# Patient Record
Sex: Male | Born: 1990 | Race: Black or African American | Hispanic: No | Marital: Single | Smoking: Current every day smoker
Health system: Southern US, Community
[De-identification: ages and names within clinical notes are randomized; demographics above are authoritative.]

## PROBLEM LIST (undated history)

## (undated) HISTORY — PX: FINGER SURGERY: SHX640

## (undated) HISTORY — PX: TONSILLECTOMY: SUR1361

---
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
## 2010-07-17 ENCOUNTER — Emergency Department (HOSPITAL_COMMUNITY): Admission: EM | Admit: 2010-07-17 | Discharge: 2010-07-17 | Payer: Self-pay | Source: Home / Self Care

## 2011-01-17 ENCOUNTER — Emergency Department (HOSPITAL_COMMUNITY): Payer: Medicaid Other

## 2011-01-17 ENCOUNTER — Emergency Department (HOSPITAL_COMMUNITY)
Admission: EM | Admit: 2011-01-17 | Discharge: 2011-01-17 | Disposition: A | Discharge status: Home / Self Care | Payer: Medicaid Other | Attending: Emergency Medicine | Admitting: Emergency Medicine

## 2011-01-17 DIAGNOSIS — W219XXA Striking against or struck by unspecified sports equipment, initial encounter: Secondary | ICD-10-CM | POA: Insufficient documentation

## 2011-01-17 DIAGNOSIS — S60229A Contusion of unspecified hand, initial encounter: Secondary | ICD-10-CM | POA: Insufficient documentation

## 2011-01-17 DIAGNOSIS — Y9367 Activity, basketball: Secondary | ICD-10-CM | POA: Insufficient documentation

## 2011-07-12 ENCOUNTER — Encounter: Payer: Self-pay | Admitting: *Deleted

## 2011-07-12 ENCOUNTER — Emergency Department (HOSPITAL_COMMUNITY): Payer: Medicaid Other

## 2011-07-12 ENCOUNTER — Emergency Department (HOSPITAL_COMMUNITY)
Admission: EM | Admit: 2011-07-12 | Discharge: 2011-07-12 | Disposition: A | Discharge status: Home / Self Care | Payer: Medicaid Other | Attending: Emergency Medicine | Admitting: Emergency Medicine

## 2011-07-12 DIAGNOSIS — S62609A Fracture of unspecified phalanx of unspecified finger, initial encounter for closed fracture: Secondary | ICD-10-CM

## 2011-07-12 DIAGNOSIS — Y92410 Unspecified street and highway as the place of occurrence of the external cause: Secondary | ICD-10-CM | POA: Insufficient documentation

## 2011-07-12 DIAGNOSIS — IMO0002 Reserved for concepts with insufficient information to code with codable children: Secondary | ICD-10-CM | POA: Insufficient documentation

## 2011-07-12 DIAGNOSIS — F17200 Nicotine dependence, unspecified, uncomplicated: Secondary | ICD-10-CM | POA: Insufficient documentation

## 2011-07-12 MED ORDER — ONDANSETRON 8 MG PO TBDP
8.0000 mg | ORAL_TABLET | Freq: Once | ORAL | Status: AC
  Administered 2011-07-12: 8 mg via ORAL
  Filled 2011-07-12: qty 1

## 2011-07-12 MED ORDER — OXYCODONE-ACETAMINOPHEN 5-325 MG PO TABS: 1.0000 | ORAL_TABLET | ORAL | Status: AC | PRN

## 2011-07-12 MED ORDER — MORPHINE SULFATE 10 MG/ML IJ SOLN
6.0000 mg | Freq: Once | INTRAMUSCULAR | Status: AC
  Administered 2011-07-12: 6 mg via INTRAMUSCULAR

## 2011-07-12 MED ORDER — BUPIVACAINE HCL (PF) 0.5 % IJ SOLN
INTRAMUSCULAR | Status: AC
  Filled 2011-07-12: qty 30

## 2011-07-12 MED ORDER — MORPHINE SULFATE 10 MG/ML IJ SOLN
6.0000 mg | Freq: Once | INTRAMUSCULAR | Status: DC
  Filled 2011-07-12: qty 1

## 2011-07-12 MED ORDER — BUPIVACAINE HCL (PF) 0.5 % IJ SOLN
30.0000 mL | Freq: Once | INTRAMUSCULAR | Status: AC
  Administered 2011-07-12: 30 mL

## 2011-07-12 MED ORDER — IBUPROFEN 800 MG PO TABS
800.0000 mg | ORAL_TABLET | Freq: Once | ORAL | Status: AC
  Administered 2011-07-12: 800 mg via ORAL
  Filled 2011-07-12: qty 1

## 2011-07-12 NOTE — ED Provider Notes (Signed)
Medical screening examination/treatment/procedure(s) were performed by non-physician practitioner and as supervising physician I was immediately available for consultation/collaboration.   Juliet Rude. Rubin Payor, MD 07/12/11 2211

## 2011-07-12 NOTE — ED Notes (Signed)
Pt a/ox4. Resp even and unlabored. NAD at this time. D/C instructions and Rx reviewed with pt. Pt verbalized understanding. Pt ambulated to lobby with steady gate.  
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
## 2011-10-31 ENCOUNTER — Emergency Department (HOSPITAL_COMMUNITY): Payer: Medicaid Other

## 2011-10-31 ENCOUNTER — Emergency Department (HOSPITAL_COMMUNITY)
Admission: EM | Admit: 2011-10-31 | Discharge: 2011-10-31 | Disposition: A | Discharge status: Home / Self Care | Payer: Medicaid Other | Attending: Emergency Medicine | Admitting: Emergency Medicine

## 2011-10-31 ENCOUNTER — Encounter (HOSPITAL_COMMUNITY): Payer: Self-pay

## 2011-10-31 DIAGNOSIS — S93409A Sprain of unspecified ligament of unspecified ankle, initial encounter: Secondary | ICD-10-CM | POA: Insufficient documentation

## 2011-10-31 DIAGNOSIS — M25579 Pain in unspecified ankle and joints of unspecified foot: Secondary | ICD-10-CM | POA: Insufficient documentation

## 2011-10-31 DIAGNOSIS — W219XXA Striking against or struck by unspecified sports equipment, initial encounter: Secondary | ICD-10-CM | POA: Insufficient documentation

## 2011-10-31 DIAGNOSIS — Y9367 Activity, basketball: Secondary | ICD-10-CM | POA: Insufficient documentation

## 2011-10-31 DIAGNOSIS — S93402A Sprain of unspecified ligament of left ankle, initial encounter: Secondary | ICD-10-CM

## 2011-10-31 DIAGNOSIS — M25476 Effusion, unspecified foot: Secondary | ICD-10-CM | POA: Insufficient documentation

## 2011-10-31 DIAGNOSIS — F17200 Nicotine dependence, unspecified, uncomplicated: Secondary | ICD-10-CM | POA: Insufficient documentation

## 2011-10-31 DIAGNOSIS — M25473 Effusion, unspecified ankle: Secondary | ICD-10-CM | POA: Insufficient documentation

## 2011-10-31 MED ORDER — HYDROCODONE-ACETAMINOPHEN 5-325 MG PO TABS: 1.0000 | ORAL_TABLET | ORAL | Status: AC | PRN

## 2011-10-31 MED ORDER — IBUPROFEN 600 MG PO TABS: 600.0000 mg | ORAL_TABLET | Freq: Four times a day (QID) | ORAL | Status: AC | PRN

## 2011-10-31 NOTE — ED Notes (Signed)
Left ankle pain x3 hours, was playing basketball and someone landed on left ankle/foot.

## 2011-10-31 NOTE — Discharge Instructions (Signed)
Ankle Sprain An ankle sprain is an injury to the strong, fibrous tissues (ligaments) that hold the bones of your ankle joint together.  CAUSES Ankle sprain usually is caused by a fall or by twisting your ankle. People who participate in sports are more prone to these types of injuries.  SYMPTOMS  Symptoms of ankle sprain include:  Pain in your ankle. The pain may be present at rest or only when you are trying to stand or walk.   Swelling.   Bruising. Bruising may develop immediately or within 1 to 2 days after your injury.   Difficulty standing or walking.  DIAGNOSIS  Your caregiver will ask you details about your injury and perform a physical exam of your ankle to determine if you have an ankle sprain. During the physical exam, your caregiver will press and squeeze specific areas of your foot and ankle. Your caregiver will try to move your ankle in certain ways. An X-ray exam may be done to be sure a bone was not broken or a ligament did not separate from one of the bones in your ankle (avulsion).  TREATMENT  Certain types of braces can help stabilize your ankle. Your caregiver can make a recommendation for this. Your caregiver may recommend the use of medication for pain. If your sprain is severe, your caregiver may refer you to a surgeon who helps to restore function to parts of your skeletal system (orthopedist) or a physical therapist. HOME CARE INSTRUCTIONS  Apply ice to your injury for 1 to 2 days or as directed by your caregiver. Applying ice helps to reduce inflammation and pain.  Put ice in a plastic bag.   Place a towel between your skin and the bag.   Leave the ice on for 15 to 20 minutes at a time, every 2 hours while you are awake.   Take over-the-counter or prescription medicines for pain, discomfort, or fever only as directed by your caregiver.   Keep your injured leg elevated, when possible, to lessen swelling.   If your caregiver recommends crutches, use them as  instructed. Gradually, put weight on the affected ankle. Continue to use crutches or a cane until you can walk without feeling pain in your ankle.   If you have a plaster splint, wear the splint as directed by your caregiver. Do not rest it on anything harder than a pillow the first 24 hours. Do not put weight on it. Do not get it wet. You may take it off to take a shower or bath.   You may have been given an elastic bandage to wear around your ankle to provide support. If the elastic bandage is too tight (you have numbness or tingling in your foot or your foot becomes cold and blue), adjust the bandage to make it comfortable.   If you have an air splint, you may blow more air into it or let air out to make it more comfortable. You may take your splint off at night and before taking a shower or bath.   Wiggle your toes in the splint several times per day if you are able.  SEEK MEDICAL CARE IF:   You have an increase in bruising, swelling, or pain.   Your toes feel cold.   Pain relief is not achieved with medication.  SEEK IMMEDIATE MEDICAL CARE IF: Your toes are numb or blue or you have severe pain. MAKE SURE YOU:   Understand these instructions.   Will watch your condition.     Will get help right away if you are not doing well or get worse.  Document Released: 08/04/2005 Document Revised: 07/24/2011 Document Reviewed: 03/08/2008 [facility] Patient Information 2012 [place], [place].   Follow the instructions above for the next several days.  As instructed he should call Dr. Romeo Apple for recheck if your pain persists or worsen as you try to come off the crutches over the next 4-5 days.  Continue to wear the ASO brace on your left ankle whenever you are going to be active for at least the next month as this will provide added support and prevent you from reinjuring this ankle.  You may use ibuprofen as prescribed which will help you with pain and inflammation.  Use the hydrocodone if needed  for extra pain relief, but do not drive within 4 hours of taking if this will make you drowsy.

## 2011-11-02 NOTE — ED Provider Notes (Signed)
History     CSN: [id number]  Arrival date & time 10/31/11  1345   First MD Initiated Contact with Patient 10/31/11 1437      Chief Complaint  Patient presents with  . Ankle Pain    (Consider location/radiation/quality/duration/timing/severity/associated sxs/prior treatment) Patient is a 21 y.o. male presenting with ankle pain. The history is provided by the patient.  Ankle Pain  The incident occurred 3 to 5 hours ago. The incident occurred at the gym (Was playing basketball, when he tripped and someone landed on his left ankle and foot.). The injury mechanism was torsion. The pain is present in the left ankle. The pain is at a severity of 8/10. The pain is moderate. The pain has been constant since onset. Pertinent negatives include no numbness, no inability to bear weight, no loss of motion, no loss of sensation and no tingling. The symptoms are aggravated by bearing weight and palpation. He has tried nothing for the symptoms. The treatment provided no relief.    History reviewed. No pertinent past medical history.  Past Surgical History  Procedure Date  . Finger surgery     No family history on file.  History  Substance Use Topics  . Smoking status: Current Everyday Smoker -- 1.0 packs/day  . Smokeless tobacco: Not on file  . Alcohol Use: No      Review of Systems  Constitutional: Negative for fever.  HENT: Negative for congestion, sore throat and neck pain.   Eyes: Negative.   Respiratory: Negative.   Cardiovascular: Negative.   Gastrointestinal: Negative for abdominal pain.  Genitourinary: Negative.   Musculoskeletal: Positive for joint swelling and arthralgias.  Skin: Negative.  Negative for rash and wound.  Neurological: Negative for dizziness, tingling, weakness, light-headedness, numbness and headaches.  Hematological: Negative.   Psychiatric/Behavioral: Negative.     Allergies  Review of patient's allergies indicates no known allergies.  Home  Medications   Current Outpatient Rx  Name Route Sig Dispense Refill  . HYDROCODONE-ACETAMINOPHEN 5-325 MG PO TABS Oral Take 1 tablet by mouth every 4 (four) hours as needed for pain. 10 tablet 0  . IBUPROFEN 600 MG PO TABS Oral Take 1 tablet (600 mg total) by mouth every 6 (six) hours as needed for pain. 20 tablet 0    BP 116/63  Pulse 60  Temp(Src) 98.5 F (36.9 C) (Oral)  Resp 20  Ht 5\' 7"  (1.702 m)  Wt 190 lb (86.183 kg)  BMI 29.76 kg/m2  SpO2 100%  Physical Exam  Nursing note and vitals reviewed. Constitutional: He is oriented to person, place, and time. He appears well-developed and well-nourished.  HENT:  Head: Normocephalic.  Eyes: Conjunctivae are normal.  Neck: Normal range of motion.  Cardiovascular: Normal rate and intact distal pulses.  Exam reveals no decreased pulses.   Pulses:      Dorsalis pedis pulses are 2+ on the right side, and 2+ on the left side.       Posterior tibial pulses are 2+ on the right side, and 2+ on the left side.  Pulmonary/Chest: Effort normal.  Musculoskeletal: He exhibits edema and tenderness.       Left ankle: He exhibits swelling. He exhibits normal range of motion and normal pulse. tenderness. Lateral malleolus tenderness found. Achilles tendon normal.  Neurological: He is alert and oriented to person, place, and time. No sensory deficit.  Skin: Skin is warm, dry and intact.    ED Course  Procedures (including critical care time)  1. Left ankle sprain       MDM  ASO and crutches provided.  Cap refill normal after ASO applied.  RICE, referral to ortho if pain symptoms and swelling are not better over the next 5 days.  Ibuprofen and hydrocodone prescribed.        Candis Musa, PA 11/02/11 2121

## 2011-11-03 NOTE — ED Provider Notes (Signed)
Medical screening examination/treatment/procedure(s) were performed by non-physician practitioner and as supervising physician I was immediately available for consultation/collaboration.  Niylah Hassan, MD 11/03/11 1102 

## 2013-10-31 ENCOUNTER — Emergency Department (HOSPITAL_COMMUNITY)
Admission: EM | Admit: 2013-10-31 | Discharge: 2013-10-31 | Disposition: A | Discharge status: Home / Self Care | Payer: Medicaid Other | Attending: Emergency Medicine | Admitting: Emergency Medicine

## 2013-10-31 ENCOUNTER — Encounter (HOSPITAL_COMMUNITY): Payer: Self-pay | Admitting: Emergency Medicine

## 2013-10-31 ENCOUNTER — Emergency Department (HOSPITAL_COMMUNITY): Payer: Medicaid Other

## 2013-10-31 DIAGNOSIS — S93402A Sprain of unspecified ligament of left ankle, initial encounter: Secondary | ICD-10-CM

## 2013-10-31 DIAGNOSIS — S93409A Sprain of unspecified ligament of unspecified ankle, initial encounter: Secondary | ICD-10-CM | POA: Insufficient documentation

## 2013-10-31 DIAGNOSIS — Y9367 Activity, basketball: Secondary | ICD-10-CM | POA: Insufficient documentation

## 2013-10-31 DIAGNOSIS — Y9239 Other specified sports and athletic area as the place of occurrence of the external cause: Secondary | ICD-10-CM | POA: Insufficient documentation

## 2013-10-31 DIAGNOSIS — F17200 Nicotine dependence, unspecified, uncomplicated: Secondary | ICD-10-CM | POA: Insufficient documentation

## 2013-10-31 DIAGNOSIS — X500XXA Overexertion from strenuous movement or load, initial encounter: Secondary | ICD-10-CM | POA: Insufficient documentation

## 2013-10-31 DIAGNOSIS — Y92838 Other recreation area as the place of occurrence of the external cause: Secondary | ICD-10-CM

## 2013-10-31 MED ORDER — HYDROCODONE-ACETAMINOPHEN 5-325 MG PO TABS: 1.0000 | ORAL_TABLET | Freq: Four times a day (QID) | ORAL | Status: DC | PRN

## 2013-10-31 MED ORDER — IBUPROFEN 800 MG PO TABS
ORAL_TABLET | ORAL | Status: AC
  Filled 2013-10-31: qty 1

## 2013-10-31 MED ORDER — IBUPROFEN 800 MG PO TABS
800.0000 mg | ORAL_TABLET | Freq: Once | ORAL | Status: AC
  Administered 2013-10-31: 800 mg via ORAL

## 2013-10-31 NOTE — ED Notes (Signed)
I was playing basketball and injured my left ankle per pt.

## 2013-10-31 NOTE — ED Notes (Signed)
Lt  Ankle pain while playing basketball.  Ice pack applied, NAD

## 2013-10-31 NOTE — ED Provider Notes (Signed)
CSN: [id number]     Arrival date & time 10/31/13  2118 History   None    Chief Complaint  Patient presents with  . Ankle Pain     (Consider location/radiation/quality/duration/timing/severity/associated sxs/prior Treatment) Patient is a 23 y.o. male presenting with ankle pain. The history is provided by the patient. No language interpreter was used.  Ankle Pain Location:  Ankle Injury: yes   Ankle location:  L ankle Pain details:    Quality:  Aching Associated symptoms: swelling   Pt is a 23 year old male who presents tonight with left ankle pain. He reports that he was playing basket ball and injured his ankle, turning it to the side. He reports that is is swollen and tender to touch. He denies numbness and tingling. He has difficulty bearing weight on it due to the pain.   History reviewed. No pertinent past medical history. Past Surgical History  Procedure Laterality Date  . Finger surgery    . Tonsillectomy     History reviewed. No pertinent family history. History  Substance Use Topics  . Smoking status: Current Every Day Smoker -- 1.00 packs/day  . Smokeless tobacco: Not on file  . Alcohol Use: No    Review of Systems  Musculoskeletal: Positive for arthralgias and gait problem.  Skin: Negative for rash.  Neurological: Negative for weakness.  All other systems reviewed and are negative.      Allergies  Review of patient's allergies indicates no known allergies.  Home Medications   Current Outpatient Rx  Name  Route  Sig  Dispense  Refill  . acetaminophen (TYLENOL) 500 MG tablet   Oral   Take 500 mg by mouth every 6 (six) hours as needed.         Marland Kitchen HYDROcodone-acetaminophen (NORCO/VICODIN) 5-325 MG per tablet   Oral   Take 1-2 tablets by mouth every 6 (six) hours as needed.   6 tablet   0    BP 117/62  Pulse 93  Temp(Src) 98.3 F (36.8 C) (Oral)  Resp 24  Ht 5\' 8"  (1.727 m)  Wt 200 lb (90.719 kg)  BMI 30.42 kg/m2  SpO2 98% Physical Exam    Nursing note and vitals reviewed. Constitutional: He is oriented to person, place, and time. He appears well-developed and well-nourished.  HENT:  Head: Normocephalic and atraumatic.  Eyes: Conjunctivae and EOM are normal.  Neck: Normal range of motion. Neck supple. No JVD present. No tracheal deviation present. No thyromegaly present.  Cardiovascular: Normal rate, regular rhythm and normal heart sounds.   Pulmonary/Chest: Effort normal and breath sounds normal. No respiratory distress. He has no wheezes.  Abdominal: Soft. Bowel sounds are normal.  Musculoskeletal: Normal range of motion.       Left ankle: He exhibits swelling. Tenderness. Lateral malleolus tenderness found.       Feet:  Left ankle, TTP laterally. Good ROM, strength and sensation. No numbness or tingling. Reports difficulty walking due to increased pain.  Neurological: He is alert and oriented to person, place, and time.  Skin: Skin is warm and dry.  Psychiatric: He has a normal mood and affect. His behavior is normal. Judgment and thought content normal.    ED Course  Procedures (including critical care time) Labs Review Labs Reviewed - No data to display Imaging Review No results found.   EKG Interpretation None      MDM   Final diagnoses:  Left ankle sprain   Feeling better after ibuprofen. Left ankle x-ray,  negative. ASO applied to left ankle and crutches given. Discussed plan of care with patient and he agrees. RICE instructions. No sports for at least 2 weeks. Prescription for hydrocodone given.      Irish EldersKelly Kindall Swaby, NP 11/05/13 1753

## 2013-10-31 NOTE — Discharge Instructions (Signed)
Ankle Sprain °An ankle sprain is an injury to the strong, fibrous tissues (ligaments) that hold the bones of your ankle joint together.  °CAUSES °An ankle sprain is usually caused by a fall or by twisting your ankle. Ankle sprains most commonly occur when you step on the outer edge of your foot, and your ankle turns inward. People who participate in sports are more prone to these types of injuries.  °SYMPTOMS  °· Pain in your ankle. The pain may be present at rest or only when you are trying to stand or walk. °· Swelling. °· Bruising. Bruising may develop immediately or within 1 to 2 days after your injury. °· Difficulty standing or walking, particularly when turning corners or changing directions. °DIAGNOSIS  °Your caregiver will ask you details about your injury and perform a physical exam of your ankle to determine if you have an ankle sprain. During the physical exam, your caregiver will press on and apply pressure to specific areas of your foot and ankle. Your caregiver will try to move your ankle in certain ways. An X-ray exam may be done to be sure a bone was not broken or a ligament did not separate from one of the bones in your ankle (avulsion fracture).  °TREATMENT  °Certain types of braces can help stabilize your ankle. Your caregiver can make a recommendation for this. Your caregiver may recommend the use of medicine for pain. If your sprain is severe, your caregiver may refer you to a surgeon who helps to restore function to parts of your skeletal system (orthopedist) or a physical therapist. °HOME CARE INSTRUCTIONS  °· Apply ice to your injury for 1 2 days or as directed by your caregiver. Applying ice helps to reduce inflammation and pain. °· Put ice in a plastic bag. °· Place a towel between your skin and the bag. °· Leave the ice on for 15-20 minutes at a time, every 2 hours while you are awake. °· Only take over-the-counter or prescription medicines for pain, discomfort, or fever as directed by  your caregiver. °· Elevate your injured ankle above the level of your heart as much as possible for 2 3 days. °· If your caregiver recommends crutches, use them as instructed. Gradually put weight on the affected ankle. Continue to use crutches or a cane until you can walk without feeling pain in your ankle. °· If you have a plaster splint, wear the splint as directed by your caregiver. Do not rest it on anything harder than a pillow for the first 24 hours. Do not put weight on it. Do not get it wet. You may take it off to take a shower or bath. °· You may have been given an elastic bandage to wear around your ankle to provide support. If the elastic bandage is too tight (you have numbness or tingling in your foot or your foot becomes cold and blue), adjust the bandage to make it comfortable. °· If you have an air splint, you may blow more air into it or let air out to make it more comfortable. You may take your splint off at night and before taking a shower or bath. Wiggle your toes in the splint several times per day to decrease swelling. °SEEK MEDICAL CARE IF:  °· You have rapidly increasing bruising or swelling. °· Your toes feel extremely cold or you lose feeling in your foot. °· Your pain is not relieved with medicine. °SEEK IMMEDIATE MEDICAL CARE IF: °· Your toes are numb   or blue.  You have severe pain that is increasing. MAKE SURE YOU:   Understand these instructions.  Will watch your condition.  Will get help right away if you are not doing well or get worse. Document Released: 08/04/2005 Document Revised: 04/28/2012 Document Reviewed: 08/16/2011 [facility]Exit[facility] Patient Information 2014 [place], [place].  RICE: Routine Care for Injuries The routine care of many injuries includes Rest, Ice, Compression, and Elevation (RICE). HOME CARE INSTRUCTIONS  Rest is needed to allow your body to heal. Routine activities can usually be resumed when comfortable. Injured tendons and bones can take up to 6 weeks  to heal. Tendons are the cord-like structures that attach muscle to bone.  Ice following an injury helps keep the swelling down and reduces pain.  Put ice in a plastic bag.  Place a towel between your skin and the bag.  Leave the ice on for 15-20 minutes, 03-04 times a day. Do this while awake, for the first 24 to 48 hours. After that, continue as directed by your caregiver.  Compression helps keep swelling down. It also gives support and helps with discomfort. If an elastic bandage has been applied, it should be removed and reapplied every 3 to 4 hours. It should not be applied tightly, but firmly enough to keep swelling down. Watch fingers or toes for swelling, bluish discoloration, coldness, numbness, or excessive pain. If any of these problems occur, remove the bandage and reapply loosely. Contact your caregiver if these problems continue.  Elevation helps reduce swelling and decreases pain. With extremities, such as the arms, hands, legs, and feet, the injured area should be placed near or above the level of the heart, if possible. SEEK IMMEDIATE MEDICAL CARE IF:  You have persistent pain and swelling.  You develop redness, numbness, or unexpected weakness.  Your symptoms are getting worse rather than improving after several days. These symptoms may indicate that further evaluation or further X-rays are needed. Sometimes, X-rays may not show a small broken bone (fracture) until 1 week or 10 days later. Make a follow-up appointment with your caregiver. Ask when your X-ray results will be ready. Make sure you get your X-ray results. Document Released: 11/16/2000 Document Revised: 10/27/2011 Document Reviewed: 01/03/2011 [facility] UniversityExitCare Patient Information 2014 [place]Exit[place], [place].  Take ibuprofen for mild to moderate pain Take hydrocodone for moderate to severe pain Use crutches and ankle splint Follow RICE instructions

## 2013-11-06 NOTE — ED Provider Notes (Signed)
Medical screening examination/treatment/procedure(s) were performed by non-physician practitioner and as supervising physician I was immediately available for consultation/collaboration.   EKG Interpretation None       Jaylynne Birkhead, MD 11/06/13 0823 

## 2013-12-28 ENCOUNTER — Emergency Department (HOSPITAL_COMMUNITY)
Admission: EM | Admit: 2013-12-28 | Discharge: 2013-12-28 | Disposition: A | Discharge status: Home / Self Care | Payer: Self-pay | Attending: Emergency Medicine | Admitting: Emergency Medicine

## 2013-12-28 ENCOUNTER — Emergency Department (HOSPITAL_COMMUNITY): Payer: Self-pay

## 2013-12-28 ENCOUNTER — Encounter (HOSPITAL_COMMUNITY): Payer: Self-pay | Admitting: Emergency Medicine

## 2013-12-28 DIAGNOSIS — R296 Repeated falls: Secondary | ICD-10-CM | POA: Insufficient documentation

## 2013-12-28 DIAGNOSIS — Y9367 Activity, basketball: Secondary | ICD-10-CM | POA: Insufficient documentation

## 2013-12-28 DIAGNOSIS — Y92838 Other recreation area as the place of occurrence of the external cause: Secondary | ICD-10-CM

## 2013-12-28 DIAGNOSIS — S93409A Sprain of unspecified ligament of unspecified ankle, initial encounter: Secondary | ICD-10-CM | POA: Insufficient documentation

## 2013-12-28 DIAGNOSIS — Y9239 Other specified sports and athletic area as the place of occurrence of the external cause: Secondary | ICD-10-CM | POA: Insufficient documentation

## 2013-12-28 DIAGNOSIS — S93401A Sprain of unspecified ligament of right ankle, initial encounter: Secondary | ICD-10-CM

## 2013-12-28 DIAGNOSIS — F17200 Nicotine dependence, unspecified, uncomplicated: Secondary | ICD-10-CM | POA: Insufficient documentation

## 2013-12-28 MED ORDER — ACETAMINOPHEN 500 MG PO TABS
1000.0000 mg | ORAL_TABLET | Freq: Once | ORAL | Status: AC
  Administered 2013-12-28: 1000 mg via ORAL
  Filled 2013-12-28: qty 2

## 2013-12-28 MED ORDER — IBUPROFEN 400 MG PO TABS
400.0000 mg | ORAL_TABLET | Freq: Once | ORAL | Status: AC
  Administered 2013-12-28: 400 mg via ORAL
  Filled 2013-12-28: qty 1

## 2013-12-28 MED ORDER — NAPROXEN 250 MG PO TABS: 250.0000 mg | ORAL_TABLET | Freq: Two times a day (BID) | ORAL | Status: DC

## 2013-12-28 MED ORDER — TRAMADOL HCL 50 MG PO TABS: 50.0000 mg | ORAL_TABLET | Freq: Four times a day (QID) | ORAL | Status: DC | PRN

## 2013-12-28 NOTE — ED Provider Notes (Signed)
CSN: [id number]     Arrival date & time 12/28/13  [id number] History   First MD Initiated Contact with Patient 12/28/13 [phone]     Chief Complaint  Patient presents with  . Ankle Injury      HPI Pt was seen at 1015. Per pt, c/o gradual onset and persistence of constant right ankle "pain" that began last night. Pt states he was playing basketball, landed on his left foot, then fell to the right side inverting his right ankle. Pt states he cannot weight bear on his right ankle due to increasing pain. Denies hitting head, no LOC/AMS, no neck or back pain, no open wounds, no focal motor weakness, no tingling/numbness in extremities.     History reviewed. No pertinent past medical history.  Past Surgical History  Procedure Laterality Date  . Finger surgery    . Tonsillectomy      History  Substance Use Topics  . Smoking status: Current Every Day Smoker -- 1.00 packs/day  . Smokeless tobacco: Not on file  . Alcohol Use: No    Review of Systems ROS: Statement: All systems negative except as marked or noted in the HPI; Constitutional: Negative for fever and chills. ; ; Eyes: Negative for eye pain, redness and discharge. ; ; ENMT: Negative for ear pain, hoarseness, nasal congestion, sinus pressure and sore throat. ; ; Cardiovascular: Negative for chest pain, palpitations, diaphoresis, dyspnea and peripheral edema. ; ; Respiratory: Negative for cough, wheezing and stridor. ; ; Gastrointestinal: Negative for nausea, vomiting, diarrhea, abdominal pain, blood in stool, hematemesis, jaundice and rectal bleeding. ; ; Genitourinary: Negative for dysuria, flank pain and hematuria. ; ; Musculoskeletal: Negative for back pain and neck pain. +right ankle pain. ; ; Skin: Negative for pruritus, rash, abrasions, blisters, bruising and skin lesion.; ; Neuro: Negative for headache, lightheadedness and neck stiffness. Negative for weakness, altered level of consciousness , altered mental status, extremity weakness,  paresthesias, involuntary movement, seizure and syncope.      Allergies  Review of patient's allergies indicates no known allergies.  Home Medications   Prior to Admission medications   Medication Sig Start Date End Date Taking? Authorizing Provider  acetaminophen (TYLENOL) 500 MG tablet Take 500 mg by mouth every 6 (six) hours as needed.    Historical Provider, MD  HYDROcodone-acetaminophen (NORCO/VICODIN) 5-325 MG per tablet Take 1-2 tablets by mouth every 6 (six) hours as needed. 10/31/13   Irish EldersKelly Walker, NP   BP 109/78  Pulse 67  Temp(Src) 97.9 F (36.6 C) (Oral)  Resp 18  SpO2 99% Physical Exam 1020: Physical examination:  Nursing notes reviewed; Vital signs and O2 SAT reviewed;  Constitutional: Well developed, Well nourished, Well hydrated, In no acute distress; Head:  Normocephalic, atraumatic; Eyes: EOMI, PERRL, No scleral icterus; ENMT: Mouth and pharynx normal, Mucous membranes moist; Neck: Supple, Full range of motion, No lymphadenopathy; Cardiovascular: Regular rate and rhythm, No murmur, rub, or gallop; Respiratory: Breath sounds clear & equal bilaterally, No rales, rhonchi, wheezes.  Speaking full sentences with ease, Normal respiratory effort/excursion; Chest: Nontender, Movement normal; Abdomen: Soft, Nontender, Nondistended, Normal bowel sounds; Genitourinary: No CVA tenderness; Extremities: Pulses normal, NT right hip/knee/foot. +tender to palp right lateral maleolar area w/mild localized edema, NMS intact right foot, strong pedal pp, LE muscle compartments soft.  No right proximal fibular head tenderness, no knee tenderness, no foot tenderness.  No deformity, no ecchymosis, no open wounds. +plantarflexion of right foot w/calf squeeze.  No palpable gap right Achilles's tendon.  Decreased  ROM F/E d/t pain. No calf edema or asymmetry.; Neuro: AA&Ox3, Major CN grossly intact.  Speech clear. No gross focal motor or sensory deficits in extremities.; Skin: Color normal, Warm,  Dry.   ED Course  Procedures     EKG Interpretation None      MDM  MDM Reviewed: previous chart, nursing note and vitals Interpretation: x-ray   Dg Ankle Complete Right 12/28/2013   CLINICAL DATA:  Medial and lateral ankle pain and swelling.  EXAM: RIGHT ANKLE - COMPLETE 3+ VIEW  COMPARISON:  None.  FINDINGS: There is no evidence of fracture, dislocation, or joint effusion. There is no evidence of arthropathy or other focal bone abnormality. Lateral soft tissue swelling noted.  IMPRESSION: 1. Lateral soft tissue swelling.   Electronically Signed   By: Signa Kellaylor  Stroud M.D.   On: 12/28/2013 10:34    1040: No fx on XR; will tx symptomatically at this time. Dx and testing d/w pt and family.  Questions answered.  Verb understanding, agreeable to d/c home with outpt f/u.     Laray AngerKathleen M Tamel Abel, DO 12/29/13 1051

## 2013-12-28 NOTE — Discharge Instructions (Signed)
°Emergency Department Resource Guide °1) Find a Doctor and Pay Out of Pocket °Although you won't have to find out who is covered by your insurance plan, it is a good idea to ask around and get recommendations. You will then need to call the office and see if the doctor you have chosen will accept you as a new patient and what types of options they offer for patients who are self-pay. Some doctors offer discounts or will set up payment plans for their patients who do not have insurance, but you will need to ask so you aren't surprised when you get to your appointment. ° °2) Contact Your Local Health Department °Not all health departments have doctors that can see patients for sick visits, but many do, so it is worth a call to see if yours does. If you don't know where your local health department is, you can check in your phone book. The CDC also has a tool to help you locate your state's health department, and many state websites also have listings of all of their local health departments. ° °3) Find a Walk-in Clinic °If your illness is not likely to be very severe or complicated, you may want to try a walk in clinic. These are popping up all over the country in pharmacies, drugstores, and shopping centers. They're usually staffed by nurse practitioners or physician assistants that have been trained to treat common illnesses and complaints. They're usually fairly quick and inexpensive. However, if you have serious medical issues or chronic medical problems, these are probably not your best option. ° °No Primary Care Doctor: °- Call Health Connect at  832-8000 - they can help you locate a primary care doctor that  accepts your insurance, provides certain services, etc. °- Physician Referral Service- 1-800-533-3463 ° °Chronic Pain Problems: °Organization         Address  Phone   Notes  °[facility]  (336) 297-2271 Patients need to be referred by their primary care doctor.  ° °Medication  Assistance: °Organization         Address  Phone   Notes  °[place] [facility] [address] °[place], [place] [address] (336) 641-8030 --Must be a resident of [place] °-- Must have NO insurance coverage whatsoever (no Medicaid/ Medicare, etc.) °-- The pt. MUST have a primary care doctor that directs their care regularly and follows them in the community °  °[facility]  (866) 331-1348   °[employer]  (888) 892-1162   ° °Agencies that provide inexpensive medical care: °Organization         Address  Phone   Notes  °[facility]  (336) 832-8035   °[facility]    (336) 832-7272   °[facility] [address] °[place], [place] [address] (336) 832-4777   °[facility] [address], °[place] (336) 271-4999   °[facility]    (336) 373-0678   °[facility]    (336) 272-1050   °[facility] ° [address], [place] Phone:  (336) 832-4444, Fax:  (336) 832-4440 Hours of Operation:  9 am - 6 pm, M-F.  Also accepts Medicaid/Medicare and self-pay.  °[facility] ° [address], [place] Phone: (336) 832-3150, Fax: (336) 832-3151. Hours of Operation:  8:30 am - 5:30 pm, M-F.  Also accepts Medicaid and self-pay.  °[facility] [place] [address], [place] Phone: (336) 878-6027   °[facility] [address], [place], [place] (336)723-1848, Ext. 123 Mondays & Thursdays: 7-9 AM.  First 15 patients are seen on a first come, first serve basis. °  ° °Medicaid-accepting [place] Providers: ° °Organization         Address  Phone   Notes  °[facility] [address], [place] (336) 641-2100 Also accepts self-pay patients.  °[facility] [address], [place] ° (336) 856-9996   °[facility] [address], [place]  (336) 288-8857   °[facility] [address] [place] [address], [place] (336) 299-7000   °Veita Bland [address], [place]  ° (336) 373-1557 Only accepts [place] Access Medicaid patients after they have their name applied to their card.  ° °Self-Pay (no insurance) in [place]: ° °Organization         Address  Phone   Notes  °Sickle Cell Patients, [facility] [address], [place] (336) 832-1970   °[facility] [address], [place] (336) 832-4400   °[facility] ° [address], [place] (336) 992-4800   °[facility]/Dr. Osei-Bonsu ° [address] [place] [address], [place] or [address], [place] (336) 841-8500 Phone number for both [place] and [place] locations is the same.  °[facility] [address], [place] (336) 299-0000   °[facility] [address] [place] [address], [place] or [address] (336) 852-7530 °(336) 878-2260   °[facility] [address], [place] (336) 350-1642, phone; (336) 294-5005, fax Sees patients 1st and 3rd Saturday of every month.  Must not qualify for public or private insurance (i.e. Medicaid, Medicare, [place] Health Choice, Veterans' Benefits) • Household income should be no more than 200% of the poverty level •The clinic cannot treat you if you are pregnant or think you are pregnant • Sexually transmitted diseases are not treated at the clinic.  ° ° °Dental Care: °Organization         Address  Phone  Notes  °[place] [facility] [address], [place] (336) 641-6152 Accepts children up to age 21 who are enrolled in Medicaid or [place] Health Choice; pregnant women with a Medicaid card; and children who have applied for Medicaid or [place] Health Choice, but were declined, whose parents can pay a reduced fee at time of service.  °[place]  [facility] [place]  [address], [place] (336) 641-7733 Accepts children up to age 21 who are enrolled in Medicaid or [place] Health Choice; pregnant women with a Medicaid card; and children who have applied for Medicaid or [place] Health Choice, but were declined, whose parents can pay a reduced fee at time of service.  °[facility] ° [address], [place] (336) 641-4533 Patients are seen by appointment only. Walk-ins are not accepted. [facility] will see patients 18 years of age and older. °Monday - Tuesday (8am-5pm) °Most Wednesdays (8:30-5pm) °$30 per visit, cash only  °[facility] ° [address], [place] (336) 641-4533 Patients are seen by appointment only. Walk-ins are not accepted. [facility] will see patients 18 years of age and older. °One   Wednesday Evening (Monthly: Volunteer Based).  $30 per visit, cash only  °[facility]  (919) 537-3737 for adults; Children under age 4, call Graduate Pediatric Dentistry at (919) [phone]. Children aged 4-14, please call (919) [phone] to request a pediatric application. ° Dental services are provided in all areas of dental care including fillings, crowns and bridges, complete and partial dentures, implants, gum treatment, root canals, and extractions. Preventive care is also provided. Treatment is provided to both adults and children. °Patients are selected via a lottery and there is often a waiting list. °  °[facility] [address], °[place] ° (336) 763-8833 [email or web address] °  °[facility] [address], [place], [place] (336)723-1848, Ext. 123 Second and Fourth Thursday of each month, opens at 6:30 AM; Clinic ends at 9 AM.  Patients are seen on a first-come first-served basis, and a limited number are seen during each clinic.  ° °[facility] ° [address], [place], [place] (336) 723-7904    Eligibility Requirements °You must have lived in [place], [place], or [place] counties for at least the last three months. °  You cannot be eligible for state or federal sponsored healthcare insurance, including Veterans Administration, Medicaid, or Medicare. °  You generally cannot be eligible for healthcare insurance through your employer.  °  How to apply: °Eligibility screenings are held every Tuesday and Wednesday afternoon from 1:00 pm until 4:00 pm. You do not need an appointment for the interview!  °[facility] [address], [place], [place] 336-631-2330   °[facility]  336-342-8273   °[facility]  336-703-3100   °[facility]  336-570-6415   ° °Behavioral Health Resources in the Community: °Intensive Outpatient Programs °Organization         Address  Phone  Notes  °[facility] [address], [place], [place] 336-878-6098   °[facility] [address], [place], [place] 336-832-9800   °[facility] [address], [place], [place] ° 336-882-2125   °[facility] [address],  °[place], [place] 1-800-853-5163 or 336-641-4981   °Substance Abuse Resources °Organization         Address  Phone  Notes  °[facility]  336-882-2125   °[facility]  336-784-9470   °[facility]  336-285-9073   °[facility]  336-845-3988   °Residential & Outpatient Substance Abuse Program  1-800-659-3381   °Psychological Services °Organization         Address  Phone  Notes  °[facility]  336- 832-9600   °[facility]  336- 378-7881   °[facility] [address], [place] 1-800-853-5163 or 336-641-4981   ° °Mobile Crisis Teams °Organization         Address  Phone  Notes  °[facility], Mobile Crisis Care Unit  1-877-626-1772   °Assertive °[facility] ° [address]  [place], [place] 336-834-9664   °Sharon DeEsch [address] °[place] 336-554-5454   ° °Self-Help/Support Groups °Organization         Address  Phone             Notes  °[facility] of  - variety of support groups  336- 373-1402 Call for more information  °Narcotics Anonymous (NA), [facility] [address], °[place]  2 meetings at this location  ° °  Residential Treatment Programs Organization         Address  Phone  Notes  [facility] [address],    [place]  [phone]   [facility]  [address], [place] [phone], [place], [place] [phone]   [facility] [address], [place] [phone] Admissions: 8am-3pm M-F  [facility] [address]    [place], [place] [phone]([phone]   [facility] [address], [place], [place] [phone]   [facility] [address]  [place], [place] [phone]   [facility] Intensive Outpatient [address], [place], [place] [phone]   [facility] [address]  [place], [place] [phone]) 501-4114 or [phone](737) [phone]   [facility] ([facility] [address] [place], [place] [phone] Accepts Medicaid  [facility] [address]  [place] [phone] Substance Abuse/Addiction Treatment   [facility]         Address  Phone  Notes  [facility]  (630) 387-0617(888) (727)233-2331   Angie FavaJulie Brannon, PhD [address], [address] [place], [place]   [phone](336) [phone] or [phone](336) (250)[phone]   [facility]   [address] [place], [place] [phone](336) [phone]   [facility] [address], [place], [place] [phone](336) [phone] Insurance/Medicaid/sponsorship through [facility] [address]                                    [place], [place] [phone](336) [phone] Therapy/tele-psych/case    [facility] [address]   [place], [place] [phone](336) [phone]    Dr. Lolly MustacheArfeen  [phone](336) [phone]   [facility]  [employer] [facility] 1) [address], [place] 2) [address], [place] 3)  [address], [place] [phone]) [phone](336) [phone](336) [phone]  [phone](336) [phone]   [facility] Child Abuse Hotline (228)845-6289(336) 912-194-2994 or [phone](336) [phone] (After Hours)       Take the prescriptions as directed. Use crutches for the next 5 days.  Wear the airsplint for support for the next 2 weeks as you gradually return to your usual activities.  Call your regular medical doctor or the Orthopedist today to schedule a follow up appointment within the next week.  Return to the Emergency Department immediately if worsening.

## 2013-12-28 NOTE — ED Notes (Addendum)
Right ankle injury after playing basketball last night.

## 2014-05-30 ENCOUNTER — Encounter (HOSPITAL_COMMUNITY): Payer: Self-pay | Admitting: Emergency Medicine

## 2014-05-30 ENCOUNTER — Emergency Department (HOSPITAL_COMMUNITY): Payer: Self-pay

## 2014-05-30 ENCOUNTER — Emergency Department (HOSPITAL_COMMUNITY)
Admission: EM | Admit: 2014-05-30 | Discharge: 2014-05-30 | Disposition: A | Discharge status: Home / Self Care | Payer: Self-pay | Attending: Emergency Medicine | Admitting: Emergency Medicine

## 2014-05-30 DIAGNOSIS — Z720 Tobacco use: Secondary | ICD-10-CM | POA: Insufficient documentation

## 2014-05-30 DIAGNOSIS — S51802A Unspecified open wound of left forearm, initial encounter: Secondary | ICD-10-CM | POA: Insufficient documentation

## 2014-05-30 DIAGNOSIS — W3400XA Accidental discharge from unspecified firearms or gun, initial encounter: Secondary | ICD-10-CM

## 2014-05-30 DIAGNOSIS — Z23 Encounter for immunization: Secondary | ICD-10-CM | POA: Insufficient documentation

## 2014-05-30 MED ORDER — CEPHALEXIN 500 MG PO CAPS: 500.0000 mg | ORAL_CAPSULE | Freq: Four times a day (QID) | ORAL | Status: DC

## 2014-05-30 MED ORDER — TETANUS-DIPHTH-ACELL PERTUSSIS 5-2.5-18.5 LF-MCG/0.5 IM SUSP
0.5000 mL | Freq: Once | INTRAMUSCULAR | Status: AC
  Administered 2014-05-30: 0.5 mL via INTRAMUSCULAR
  Filled 2014-05-30: qty 0.5

## 2014-05-30 MED ORDER — TRAMADOL HCL 50 MG PO TABS: 50.0000 mg | ORAL_TABLET | Freq: Four times a day (QID) | ORAL | Status: DC | PRN

## 2014-05-30 MED ORDER — HYDROMORPHONE HCL 1 MG/ML IJ SOLN
1.0000 mg | Freq: Once | INTRAMUSCULAR | Status: AC
  Administered 2014-05-30: 1 mg via INTRAVENOUS
  Filled 2014-05-30: qty 1

## 2014-05-30 MED ORDER — ONDANSETRON HCL 4 MG/2ML IJ SOLN
4.0000 mg | Freq: Once | INTRAMUSCULAR | Status: AC
  Administered 2014-05-30: 4 mg via INTRAVENOUS
  Filled 2014-05-30: qty 2

## 2014-05-30 MED ORDER — CEPHALEXIN 500 MG PO CAPS
500.0000 mg | ORAL_CAPSULE | Freq: Once | ORAL | Status: AC
  Administered 2014-05-30: 500 mg via ORAL
  Filled 2014-05-30: qty 1
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
## 2014-05-30 NOTE — ED Notes (Signed)
Pt ambulated to bathroom independently
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
## 2014-05-30 NOTE — Discharge Instructions (Signed)
Follow up with dr. Romeo AppleHarrison next week.   Follow up with him sooner if worsening pain or swelling
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
## 2014-06-01 ENCOUNTER — Telehealth: Payer: Self-pay | Admitting: Orthopedic Surgery

## 2014-06-01 NOTE — Telephone Encounter (Signed)
Patient called an left message on machine to schedule appointment for ER F/U and I have called the patient back to schedule and I got know answer nor machine

## 2014-06-01 NOTE — Telephone Encounter (Signed)
Patients grandmother is calling to ask if there is something Dean Myers can do for pain, the ER did put him on Antibiotics but didn't give him anything for pain. I do have him scheduled for an Appointment on 10/20 please advise?

## 2014-06-01 NOTE — Telephone Encounter (Signed)
Routing to Dr Harrison 

## 2014-06-01 NOTE — Telephone Encounter (Signed)
TAKE TYLENOL  AND IBUPROFEN

## 2014-06-06 ENCOUNTER — Ambulatory Visit (INDEPENDENT_AMBULATORY_CARE_PROVIDER_SITE_OTHER): Payer: Self-pay | Admitting: Orthopedic Surgery

## 2014-06-06 ENCOUNTER — Encounter: Payer: Self-pay | Admitting: Orthopedic Surgery

## 2014-06-06 VITALS — BP 114/75 | Ht 69.0 in | Wt 180.0 lb

## 2014-06-06 DIAGNOSIS — Y249XXA Unspecified firearm discharge, undetermined intent, initial encounter: Secondary | ICD-10-CM | POA: Insufficient documentation

## 2014-06-06 DIAGNOSIS — W3400XA Accidental discharge from unspecified firearms or gun, initial encounter: Secondary | ICD-10-CM | POA: Insufficient documentation

## 2014-06-06 DIAGNOSIS — G5630 Lesion of radial nerve, unspecified upper limb: Secondary | ICD-10-CM | POA: Insufficient documentation

## 2014-06-06 DIAGNOSIS — T148 Other injury of unspecified body region: Secondary | ICD-10-CM

## 2014-06-06 DIAGNOSIS — G5632 Lesion of radial nerve, left upper limb: Secondary | ICD-10-CM

## 2014-06-06 MED ORDER — IBUPROFEN 800 MG PO TABS: 800.0000 mg | ORAL_TABLET | Freq: Three times a day (TID) | ORAL | Status: DC | PRN

## 2014-06-06 MED ORDER — HYDROCODONE-ACETAMINOPHEN 5-325 MG PO TABS: 1.0000 | ORAL_TABLET | Freq: Four times a day (QID) | ORAL | Status: DC | PRN

## 2014-06-06 NOTE — Patient Instructions (Signed)
Call therapy dept for splint
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
## 2014-06-12 ENCOUNTER — Ambulatory Visit (HOSPITAL_COMMUNITY)
Admission: RE | Admit: 2014-06-12 | Discharge: 2014-06-12 | Disposition: A | Discharge status: Home / Self Care | Payer: Self-pay | Source: Ambulatory Visit | Attending: Orthopedic Surgery | Admitting: Orthopedic Surgery

## 2014-06-12 DIAGNOSIS — M25642 Stiffness of left hand, not elsewhere classified: Secondary | ICD-10-CM | POA: Insufficient documentation

## 2014-06-12 DIAGNOSIS — Z5189 Encounter for other specified aftercare: Secondary | ICD-10-CM | POA: Insufficient documentation

## 2014-06-12 DIAGNOSIS — Z4689 Encounter for fitting and adjustment of other specified devices: Secondary | ICD-10-CM | POA: Insufficient documentation
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
## 2014-06-20 ENCOUNTER — Other Ambulatory Visit: Payer: Self-pay | Admitting: *Deleted

## 2014-06-20 ENCOUNTER — Telehealth: Payer: Self-pay | Admitting: Orthopedic Surgery

## 2014-06-20 NOTE — Telephone Encounter (Signed)
NOT DUE UNTIL NEXT WEEK, WILL FILL LATER THIS WEEK

## 2014-06-20 NOTE — Telephone Encounter (Signed)
Patient reqeusts refill of pain medication, Hydrocodone-Norco 5-325 - please advise and please call at alternated [phone]

## 2014-06-22 ENCOUNTER — Other Ambulatory Visit: Payer: Self-pay | Admitting: *Deleted

## 2014-06-22 MED ORDER — HYDROCODONE-ACETAMINOPHEN 5-325 MG PO TABS: 1.0000 | ORAL_TABLET | Freq: Four times a day (QID) | ORAL | Status: DC | PRN

## 2014-06-26 NOTE — Telephone Encounter (Signed)
PRESCRIPTION AVAILABLE FOR PICK UP, CALLED PATIENT, UNAVAILABLE, LEFT MESSAGE 

## 2014-06-27 NOTE — Telephone Encounter (Signed)
06/27/14 patient picked up prescription.

## 2014-07-06 ENCOUNTER — Ambulatory Visit: Payer: Self-pay | Admitting: Orthopedic Surgery

## 2015-05-05 ENCOUNTER — Encounter (HOSPITAL_COMMUNITY): Payer: Self-pay | Admitting: Emergency Medicine

## 2015-05-05 ENCOUNTER — Emergency Department (HOSPITAL_COMMUNITY)
Admission: EM | Admit: 2015-05-05 | Discharge: 2015-05-05 | Discharge status: Against Medical Advice | Payer: Self-pay | Attending: Emergency Medicine | Admitting: Emergency Medicine

## 2015-05-05 ENCOUNTER — Emergency Department (HOSPITAL_COMMUNITY): Payer: Self-pay

## 2015-05-05 DIAGNOSIS — Z720 Tobacco use: Secondary | ICD-10-CM | POA: Insufficient documentation

## 2015-05-05 DIAGNOSIS — W228XXA Striking against or struck by other objects, initial encounter: Secondary | ICD-10-CM | POA: Insufficient documentation

## 2015-05-05 DIAGNOSIS — Y9289 Other specified places as the place of occurrence of the external cause: Secondary | ICD-10-CM | POA: Insufficient documentation

## 2015-05-05 DIAGNOSIS — R000 Tachycardia, unspecified: Secondary | ICD-10-CM | POA: Insufficient documentation

## 2015-05-05 DIAGNOSIS — Z792 Long term (current) use of antibiotics: Secondary | ICD-10-CM | POA: Insufficient documentation

## 2015-05-05 DIAGNOSIS — Y998 Other external cause status: Secondary | ICD-10-CM | POA: Insufficient documentation

## 2015-05-05 DIAGNOSIS — S61214A Laceration without foreign body of right ring finger without damage to nail, initial encounter: Secondary | ICD-10-CM | POA: Insufficient documentation

## 2015-05-05 DIAGNOSIS — Y9389 Activity, other specified: Secondary | ICD-10-CM | POA: Insufficient documentation
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
## 2015-05-05 NOTE — ED Notes (Signed)
Pt stated he "slammed his hand and finger in the screen door". Hand is swollen and there is a laceration to right ring finger.
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
## 2015-05-05 NOTE — ED Notes (Signed)
Soaking finger to clean. Pt not wanting to answer questions. " I can just go to [employer] and get some glue,I dont need yall's help"
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
## 2018-10-13 ENCOUNTER — Emergency Department (HOSPITAL_COMMUNITY): Payer: Medicaid Other

## 2018-10-13 ENCOUNTER — Encounter (HOSPITAL_COMMUNITY): Payer: Self-pay

## 2018-10-13 ENCOUNTER — Emergency Department (HOSPITAL_COMMUNITY)
Admission: EM | Admit: 2018-10-13 | Discharge: 2018-10-14 | Disposition: A | Discharge status: Home / Self Care | Payer: Medicaid Other | Attending: Emergency Medicine | Admitting: Emergency Medicine

## 2018-10-13 ENCOUNTER — Other Ambulatory Visit: Payer: Self-pay

## 2018-10-13 DIAGNOSIS — Y929 Unspecified place or not applicable: Secondary | ICD-10-CM | POA: Insufficient documentation

## 2018-10-13 DIAGNOSIS — F17200 Nicotine dependence, unspecified, uncomplicated: Secondary | ICD-10-CM | POA: Diagnosis not present

## 2018-10-13 DIAGNOSIS — Z203 Contact with and (suspected) exposure to rabies: Secondary | ICD-10-CM | POA: Diagnosis not present

## 2018-10-13 DIAGNOSIS — Z23 Encounter for immunization: Secondary | ICD-10-CM | POA: Insufficient documentation

## 2018-10-13 DIAGNOSIS — S81831A Puncture wound without foreign body, right lower leg, initial encounter: Secondary | ICD-10-CM | POA: Diagnosis not present

## 2018-10-13 DIAGNOSIS — Y998 Other external cause status: Secondary | ICD-10-CM | POA: Diagnosis not present

## 2018-10-13 DIAGNOSIS — S91332A Puncture wound without foreign body, left foot, initial encounter: Secondary | ICD-10-CM | POA: Insufficient documentation

## 2018-10-13 DIAGNOSIS — Z2914 Encounter for prophylactic rabies immune globin: Secondary | ICD-10-CM | POA: Insufficient documentation

## 2018-10-13 DIAGNOSIS — W540XXA Bitten by dog, initial encounter: Secondary | ICD-10-CM | POA: Insufficient documentation

## 2018-10-13 DIAGNOSIS — Y9389 Activity, other specified: Secondary | ICD-10-CM | POA: Insufficient documentation

## 2018-10-13 DIAGNOSIS — T07XXXA Unspecified multiple injuries, initial encounter: Secondary | ICD-10-CM

## 2018-10-13 DIAGNOSIS — S90872A Other superficial bite of left foot, initial encounter: Secondary | ICD-10-CM | POA: Diagnosis present

## 2018-10-13 NOTE — ED Notes (Signed)
This RN notified [employer] of dog bite at this time, will be sending an officer per [employer].

## 2018-10-13 NOTE — ED Triage Notes (Signed)
Pt came to ED with complaints of dog bite to right leg and left foot. Pt does not know dog. Pt states it happened on [facility] and unsure whose dog and if the dog was UTD on immunizations.

## 2018-10-14 MED ORDER — RABIES IMMUNE GLOBULIN 150 UNIT/ML IM INJ
20.0000 [IU]/kg | INJECTION | Freq: Once | INTRAMUSCULAR | Status: AC
  Administered 2018-10-14: 2025 [IU] via INTRAMUSCULAR
  Filled 2018-10-14: qty 13.5

## 2018-10-14 MED ORDER — AMOXICILLIN-POT CLAVULANATE 875-125 MG PO TABS
1.0000 | ORAL_TABLET | Freq: Once | ORAL | Status: AC
  Administered 2018-10-14: 1 via ORAL
  Filled 2018-10-14: qty 1

## 2018-10-14 MED ORDER — HYDROCODONE-ACETAMINOPHEN 5-325 MG PO TABS: 2.0000 | ORAL_TABLET | ORAL | 0 refills | Status: DC | PRN

## 2018-10-14 MED ORDER — HYDROCODONE-ACETAMINOPHEN 5-325 MG PO TABS
2.0000 | ORAL_TABLET | Freq: Once | ORAL | Status: AC
  Administered 2018-10-14: 2 via ORAL
  Filled 2018-10-14: qty 2

## 2018-10-14 MED ORDER — RABIES IMMUNE GLOBULIN 150 UNIT/ML IM INJ
INJECTION | INTRAMUSCULAR | Status: AC
  Filled 2018-10-14: qty 14

## 2018-10-14 MED ORDER — RABIES VACCINE, PCEC IM SUSR
1.0000 mL | Freq: Once | INTRAMUSCULAR | Status: AC
  Administered 2018-10-14: 1 mL via INTRAMUSCULAR
  Filled 2018-10-14: qty 1

## 2018-10-14 MED ORDER — AMOXICILLIN-POT CLAVULANATE 875-125 MG PO TABS: 1.0000 | ORAL_TABLET | Freq: Two times a day (BID) | ORAL | 0 refills | Status: DC

## 2018-10-14 NOTE — Discharge Instructions (Addendum)
Return for rabies shots

## 2018-10-14 NOTE — ED Notes (Signed)
Pt wheeled to waiting room. Pt verbalized understanding of discharge instructions.   
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
## 2018-11-03 MED ORDER — FENTANYL CITRATE (PF) 250 MCG/5ML IJ SOLN
INTRAMUSCULAR | Status: AC
  Filled 2018-11-03: qty 5

## 2018-11-03 MED ORDER — PROPOFOL 10 MG/ML IV BOLUS
INTRAVENOUS | Status: AC
  Filled 2018-11-03: qty 40

## 2018-11-03 MED ORDER — ONDANSETRON HCL 4 MG/2ML IJ SOLN
INTRAMUSCULAR | Status: AC
  Filled 2018-11-03: qty 2

## 2018-11-03 MED ORDER — FENTANYL CITRATE (PF) 100 MCG/2ML IJ SOLN
INTRAMUSCULAR | Status: AC
  Filled 2018-11-03: qty 2

## 2019-02-20 ENCOUNTER — Emergency Department (HOSPITAL_COMMUNITY)
Admission: EM | Admit: 2019-02-20 | Discharge: 2019-02-20 | Disposition: A | Discharge status: Home / Self Care | Payer: Medicaid Other | Attending: Emergency Medicine | Admitting: Emergency Medicine

## 2019-02-20 ENCOUNTER — Encounter (HOSPITAL_COMMUNITY): Payer: Self-pay

## 2019-02-20 ENCOUNTER — Other Ambulatory Visit: Payer: Self-pay

## 2019-02-20 ENCOUNTER — Emergency Department (HOSPITAL_COMMUNITY): Payer: Medicaid Other

## 2019-02-20 DIAGNOSIS — Y929 Unspecified place or not applicable: Secondary | ICD-10-CM | POA: Insufficient documentation

## 2019-02-20 DIAGNOSIS — Y999 Unspecified external cause status: Secondary | ICD-10-CM | POA: Insufficient documentation

## 2019-02-20 DIAGNOSIS — M545 Low back pain, unspecified: Secondary | ICD-10-CM

## 2019-02-20 DIAGNOSIS — S56419A Strain of extensor muscle, fascia and tendon of finger, unspecified finger at forearm level, initial encounter: Secondary | ICD-10-CM | POA: Diagnosis not present

## 2019-02-20 DIAGNOSIS — M79672 Pain in left foot: Secondary | ICD-10-CM | POA: Insufficient documentation

## 2019-02-20 DIAGNOSIS — F17210 Nicotine dependence, cigarettes, uncomplicated: Secondary | ICD-10-CM | POA: Insufficient documentation

## 2019-02-20 DIAGNOSIS — Y939 Activity, unspecified: Secondary | ICD-10-CM | POA: Insufficient documentation

## 2019-02-20 DIAGNOSIS — M25531 Pain in right wrist: Secondary | ICD-10-CM

## 2019-02-20 DIAGNOSIS — Z79899 Other long term (current) drug therapy: Secondary | ICD-10-CM | POA: Diagnosis not present

## 2019-02-20 DIAGNOSIS — S0083XA Contusion of other part of head, initial encounter: Secondary | ICD-10-CM | POA: Diagnosis not present

## 2019-02-20 DIAGNOSIS — M79641 Pain in right hand: Secondary | ICD-10-CM | POA: Diagnosis present

## 2019-02-20 MED ORDER — CYCLOBENZAPRINE HCL 10 MG PO TABS: 10.0000 mg | ORAL_TABLET | Freq: Two times a day (BID) | ORAL | 0 refills | Status: AC | PRN

## 2019-02-20 MED ORDER — NAPROXEN 500 MG PO TABS: 500.0000 mg | ORAL_TABLET | Freq: Two times a day (BID) | ORAL | 0 refills | Status: AC

## 2019-02-20 MED ORDER — OXYCODONE-ACETAMINOPHEN 5-325 MG PO TABS
1.0000 | ORAL_TABLET | Freq: Once | ORAL | Status: AC
  Administered 2019-02-20: 1 via ORAL
  Filled 2019-02-20: qty 1
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
## 2019-02-20 NOTE — ED Notes (Signed)
Ortho tech at bedside 
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
## 2019-05-15 ENCOUNTER — Encounter (HOSPITAL_COMMUNITY): Payer: Self-pay

## 2019-05-15 ENCOUNTER — Other Ambulatory Visit: Payer: Self-pay

## 2019-05-15 ENCOUNTER — Emergency Department (HOSPITAL_COMMUNITY)
Admission: EM | Admit: 2019-05-15 | Discharge: 2019-05-15 | Payer: Medicaid Other | Attending: Emergency Medicine | Admitting: Emergency Medicine

## 2019-05-15 DIAGNOSIS — F17200 Nicotine dependence, unspecified, uncomplicated: Secondary | ICD-10-CM | POA: Insufficient documentation

## 2019-05-15 DIAGNOSIS — F1010 Alcohol abuse, uncomplicated: Secondary | ICD-10-CM | POA: Diagnosis present

## 2019-05-15 MED ORDER — NALOXONE HCL 2 MG/2ML IJ SOSY
PREFILLED_SYRINGE | INTRAMUSCULAR | Status: AC
  Filled 2019-05-15: qty 2

## 2019-05-15 MED ORDER — ONDANSETRON HCL 4 MG/2ML IJ SOLN
INTRAMUSCULAR | Status: AC
  Filled 2019-05-15: qty 2

## 2019-05-15 NOTE — ED Notes (Addendum)
Pt is adamantly stating that he did not take any drugs and saying "these fucking pigs just assume I did drugs because I am black. Fuck the pigs" Pt did report he drank after getting off work at midnight. MD gave verbal not to administer narcan and zofran after pt stated he was not going to throw up.
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
## 2019-05-15 NOTE — ED Notes (Signed)
Pt given gingerale for PO challenge 

## 2019-05-15 NOTE — ED Notes (Addendum)
Attempted to get urine sample from pt, pt began yelling at staff saying "fuck you, you cant force me to do something I cant do. I need water. I am dehydrated." Pt given water. 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 I don't give a fuck about the badge. That badge don't mean shit to me. You motherfuckers are the most disrespectful ones. You are too busy killing my people" Pt is alert and ambulatory without assistance. Pt is discharged and in custody of [employer].   Pt ripped off cardiac leads and pulse ox saying "I can do it my fucking self. Dont fucking touch me"

## 2019-05-15 NOTE — ED Notes (Signed)
Attempted to discharge pt, pt did not respond to verbal stimuli. RN sternal rubbed pt and pt did not respond. Narcan administered and cardiac leads applied.

## 2019-06-09 ENCOUNTER — Other Ambulatory Visit: Payer: Self-pay

## 2019-06-09 DIAGNOSIS — Z20822 Contact with and (suspected) exposure to covid-19: Secondary | ICD-10-CM

## 2019-06-09 LAB — NOVEL CORONAVIRUS, NAA: SARS-CoV-2, NAA: NOT DETECTED

## 2019-06-15 ENCOUNTER — Telehealth: Payer: Self-pay | Admitting: General Practice

## 2019-06-15 NOTE — Telephone Encounter (Signed)
Negative COVID results given. Patient results "NOT Detected." Caller expressed understanding. ° °

## 2019-07-27 ENCOUNTER — Other Ambulatory Visit: Payer: Self-pay

## 2019-07-27 DIAGNOSIS — Z20822 Contact with and (suspected) exposure to covid-19: Secondary | ICD-10-CM

## 2019-07-28 LAB — NOVEL CORONAVIRUS, NAA: SARS-CoV-2, NAA: NOT DETECTED

## 2019-11-21 ENCOUNTER — Emergency Department (HOSPITAL_COMMUNITY)
Admission: EM | Admit: 2019-11-21 | Discharge: 2019-11-21 | Payer: Medicaid Other | Attending: Emergency Medicine | Admitting: Emergency Medicine

## 2019-11-21 ENCOUNTER — Encounter (HOSPITAL_COMMUNITY): Payer: Self-pay | Admitting: Emergency Medicine

## 2019-11-21 ENCOUNTER — Emergency Department (HOSPITAL_COMMUNITY): Payer: Medicaid Other

## 2019-11-21 ENCOUNTER — Other Ambulatory Visit: Payer: Self-pay

## 2019-11-21 DIAGNOSIS — F17210 Nicotine dependence, cigarettes, uncomplicated: Secondary | ICD-10-CM | POA: Diagnosis not present

## 2019-11-21 DIAGNOSIS — R000 Tachycardia, unspecified: Secondary | ICD-10-CM | POA: Insufficient documentation

## 2019-11-21 DIAGNOSIS — T50901A Poisoning by unspecified drugs, medicaments and biological substances, accidental (unintentional), initial encounter: Secondary | ICD-10-CM

## 2019-11-21 DIAGNOSIS — R4182 Altered mental status, unspecified: Secondary | ICD-10-CM | POA: Diagnosis not present

## 2019-11-21 LAB — CBC
HCT: 45.4 % (ref 39.0–52.0)
Hemoglobin: 14.6 g/dL (ref 13.0–17.0)
MCH: 32.1 pg (ref 26.0–34.0)
MCHC: 32.2 g/dL (ref 30.0–36.0)
MCV: 99.8 fL (ref 80.0–100.0)
Platelets: 301 10*3/uL (ref 150–400)
RBC: 4.55 MIL/uL (ref 4.22–5.81)
RDW: 12.8 % (ref 11.5–15.5)
WBC: 8.8 10*3/uL (ref 4.0–10.5)
nRBC: 0 % (ref 0.0–0.2)

## 2019-11-21 LAB — COMPREHENSIVE METABOLIC PANEL
ALT: 20 U/L (ref 0–44)
AST: 24 U/L (ref 15–41)
Albumin: 4.3 g/dL (ref 3.5–5.0)
Alkaline Phosphatase: 67 U/L (ref 38–126)
Anion gap: 10 (ref 5–15)
BUN: 8 mg/dL (ref 6–20)
CO2: 31 mmol/L (ref 22–32)
Calcium: 9.7 mg/dL (ref 8.9–10.3)
Chloride: 98 mmol/L (ref 98–111)
Creatinine, Ser: 1.51 mg/dL — ABNORMAL HIGH (ref 0.61–1.24)
GFR calc Af Amer: >60 mL/min (ref >60)
GFR calc non Af Amer: >60 mL/min (ref >60)
Glucose, Bld: 144 mg/dL — ABNORMAL HIGH (ref 70–99)
Potassium: 3.9 mmol/L (ref 3.5–5.1)
Sodium: 139 mmol/L (ref 135–145)
Total Bilirubin: 0.4 mg/dL (ref 0.3–1.2)
Total Protein: 7.8 g/dL (ref 6.5–8.1)

## 2019-11-21 LAB — LACTIC ACID, PLASMA: Lactic Acid, Venous: 2.1 mmol/L (ref 0.5–1.9)

## 2019-11-21 LAB — RAPID URINE DRUG SCREEN, HOSP PERFORMED
Amphetamines: NOT DETECTED
Barbiturates: NOT DETECTED
Benzodiazepines: NOT DETECTED
Cocaine: POSITIVE — AB
Opiates: NOT DETECTED
Tetrahydrocannabinol: POSITIVE — AB

## 2019-11-21 LAB — ETHANOL: Alcohol, Ethyl (B): <10 mg/dL (ref <10)

## 2019-11-21 LAB — SALICYLATE LEVEL: Salicylate Lvl: <7 mg/dL — ABNORMAL LOW (ref 7.0–30.0)

## 2019-11-21 LAB — ACETAMINOPHEN LEVEL: Acetaminophen (Tylenol), Serum: <10 ug/mL — ABNORMAL LOW (ref 10–30)

## 2019-11-21 MED ORDER — SODIUM CHLORIDE 0.9 % IV BOLUS
1000.0000 mL | Freq: Once | INTRAVENOUS | Status: AC
  Administered 2019-11-21: 10:00:00 1000 mL via INTRAVENOUS

## 2019-11-21 MED ORDER — NALOXONE HCL 0.4 MG/ML IJ SOLN: 0.4000 mg | INTRAMUSCULAR | Status: DC | PRN

## 2019-11-21 NOTE — ED Notes (Signed)
Pt ambulated around nurse's station without assistance.

## 2019-11-21 NOTE — ED Notes (Signed)
Pt given lunch tray. Pt alert and sitting up in bed eating with no complaints at this time.

## 2019-11-21 NOTE — Discharge Instructions (Addendum)
You were evaluated in the Emergency Department and after careful evaluation, we did not find any emergent condition requiring admission or further testing in the hospital. ° °Please return to the Emergency Department if you experience any worsening of your condition.  We encourage you to follow up with a primary care provider.  Thank you for allowing us to be a part of your care. °

## 2019-11-21 NOTE — ED Notes (Signed)
Pt discharge instructions reviewed with patient, patient VU. Officers at bedside. Pt attempting to stand, initially unsteady when first standing, RN went to assist pt and hold arm, pt pushed RN away. RN reminded pt pt was able to walk around nurse's station earlier without assistance.  Pt ambulatory off unit without assistance with [employer].

## 2019-11-21 NOTE — ED Notes (Signed)
Pt starting to wake up, pt removed capnography. Capnography reapplied. Pt requesting food.

## 2019-11-21 NOTE — ED Triage Notes (Signed)
PT taken into custody for warrants per [facility]. Police report patient had decreased consciousness and was given Narcan intranasally prior to ED arrival and had regained consciousness but now appears to be lethargic but responsive to verbal stimuli.

## 2019-11-21 NOTE — ED Notes (Signed)
Date and time results received: 11/21/19 1003 (use smartphrase ".now" to insert current time)  Test: Lactic Acid 2.1 Critical Value: 2.1  Name of Provider Notified: Dr. Pilar Plate   Orders Received?Fluids

## 2019-11-21 NOTE — ED Provider Notes (Signed)
[facility]-EMERGENCY DEPT [facility] Emergency Department Provider Note MRN:  [id number]  Arrival date & time: 11/21/19     Chief Complaint   Drug Overdose   History of Present Illness   Dean Myers is a 29 y.o. year-old male with unknown past medical history presenting to the ED with chief complaint of drug overdose.  Patient reportedly snorted a white substance, unknown.  Was agonal he breathing when police arrived, given intranasal Narcan, recovered well and walked to the police car, but then became somnolent again.  I was unable to obtain an accurate HPI, PMH, or ROS due to the patient's altered mental status.  Level 5 caveat.  Review of Systems  Positive for drug overdose, altered mental status.  Patient's Health History   History reviewed. No pertinent past medical history.  Past Surgical History:  Procedure Laterality Date  . FINGER SURGERY    . TONSILLECTOMY      History reviewed. No pertinent family history.  Social History   Socioeconomic History  . Marital status: Single    Spouse name: Not on file  . Number of children: Not on file  . Years of education: Not on file  . Highest education level: Not on file  Occupational History  . Not on file  Tobacco Use  . Smoking status: Current Every Day Smoker    Packs/day: 1.00  . Smokeless tobacco: Never Used  Substance and Sexual Activity  . Alcohol use: Yes  . Drug use: Yes    Types: Marijuana    Comment: opiates  . Sexual activity: Yes    Birth control/protection: None  Other Topics Concern  . Not on file  Social History Narrative  . Not on file   Social Determinants of Health   Financial Resource Strain:   . Difficulty of Paying Living Expenses:   Food Insecurity:   . Worried About Programme researcher, broadcasting/film/video in the Last Year:   . Barista in the Last Year:   Transportation Needs:   . Freight forwarder (Medical):   Marland Kitchen Lack of Transportation (Non-Medical):   Physical Activity:   . Days  of Exercise per Week:   . Minutes of Exercise per Session:   Stress:   . Feeling of Stress :   Social Connections:   . Frequency of Communication with Friends and Family:   . Frequency of Social Gatherings with Friends and Family:   . Attends Religious Services:   . Active Member of Clubs or Organizations:   . Attends Banker Meetings:   Marland Kitchen Marital Status:   Intimate Partner Violence:   . Fear of Current or Ex-Partner:   . Emotionally Abused:   Marland Kitchen Physically Abused:   . Sexually Abused:      Physical Exam   Vitals:   11/21/19 1400 11/21/19 1411  BP: 121/72   Pulse: 73 83  Resp: 15 12  Temp:    SpO2: 91% 93%    CONSTITUTIONAL: Well-appearing, NAD NEURO: Somnolent, wakes to voice, slow to answer questions, moving all extremities EYES:  eyes equal and reactive ENT/NECK:  no LAD, no JVD CARDIO: Tachycardic rate, well-perfused, normal S1 and S2 PULM:  CTAB no wheezing or rhonchi GI/GU:  normal bowel sounds, non-distended, non-tender MSK/SPINE:  No gross deformities, no edema SKIN:  no rash, atraumatic PSYCH:  Appropriate speech and behavior  *Additional and/or pertinent findings included in MDM below  Diagnostic and Interventional Summary    EKG Interpretation  Date/Time:  Monday November 21 2019 09:07:16 EDT Ventricular Rate:  118 PR Interval:    QRS Duration: 93 QT Interval:  325 QTC Calculation: 456 R Axis:   0 Text Interpretation: Sinus tachycardia Confirmed by Gerlene Fee [phone]) on 11/21/2019 10:01:20 AM      Labs Reviewed  COMPREHENSIVE METABOLIC PANEL - Abnormal; Notable for the following components:      Result Value   Glucose, Bld 144 (*)    Creatinine, Ser 1.51 (*)    All other components within normal limits  LACTIC ACID, PLASMA - Abnormal; Notable for the following components:   Lactic Acid, Venous 2.1 (*)    All other components within normal limits  SALICYLATE LEVEL - Abnormal; Notable for the following components:   Salicylate Lvl  <8.0 (*)    All other components within normal limits  ACETAMINOPHEN LEVEL - Abnormal; Notable for the following components:   Acetaminophen (Tylenol), Serum <10 (*)    All other components within normal limits  RAPID URINE DRUG SCREEN, HOSP PERFORMED - Abnormal; Notable for the following components:   Cocaine POSITIVE (*)    Tetrahydrocannabinol POSITIVE (*)    All other components within normal limits  CBC  ETHANOL    DG Chest Port 1 View  Final Result      Medications  naloxone [facility]) injection 0.4 mg (has no administration in time range)  sodium chloride 0.9 % bolus 1,000 mL (0 mLs Intravenous Stopped 11/21/19 1039)     Procedures  /  Critical Care .Critical Care Performed by: Maudie Flakes, MD Authorized by: Maudie Flakes, MD   Critical care provider statement:    Critical care time (minutes):  32   Critical care was necessary to treat or prevent imminent or life-threatening deterioration of the following conditions:  Toxidrome (Mixed drug overdose)   Critical care was time spent personally by me on the following activities:  Discussions with consultants, evaluation of patient's response to treatment, examination of patient, ordering and performing treatments and interventions, ordering and review of laboratory studies, ordering and review of radiographic studies, pulse oximetry, re-evaluation of patient's condition, obtaining history from patient or surrogate and review of old charts    ED Course and Medical Decision Making  I have reviewed the triage vital signs, the nursing notes, and pertinent available records from the EMR.  Pertinent labs & imaging results that were available during my care of the patient were reviewed by me and considered in my medical decision making (see below for details).     Suspect opioid overdose, though there is a concern for mixed overdose.  Monitoring closely, Narcan as needed, may need admission if he continues to be  sedate.  Patient was closely monitored for any airway compromise or respiratory depression for a few hours.  Slowly he returned to appropriate functional status, eating, talking, able to ambulate.  Labs reassuring, appropriate for discharge.  Patient was escorted out by police.  Barth Kirks. Sedonia Small, [place] [email or web address]  Final Clinical Impressions(s) / ED Diagnoses     ICD-10-CM   1. Accidental drug overdose, initial encounter  T50.901A     ED Discharge Orders    None       Discharge Instructions Discussed with and Provided to Patient:     Discharge Instructions     You were evaluated in the Emergency Department and after careful evaluation, we did not find any emergent condition requiring admission or further testing in the  hospital.  Please return to the Emergency Department if you experience any worsening of your condition.  We encourage you to follow up with a primary care provider.  Thank you for allowing [place] to be a part of your care.        Sabas Sous, MD 11/21/19 1504
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
## 2021-10-20 ENCOUNTER — Encounter (HOSPITAL_COMMUNITY): Payer: Self-pay

## 2021-10-20 ENCOUNTER — Ambulatory Visit (HOSPITAL_COMMUNITY)
Admission: EM | Admit: 2021-10-20 | Discharge: 2021-10-20 | Disposition: A | Discharge status: Home / Self Care | Payer: Medicaid Other | Attending: Emergency Medicine | Admitting: Emergency Medicine

## 2021-10-20 ENCOUNTER — Other Ambulatory Visit: Payer: Self-pay

## 2021-10-20 DIAGNOSIS — K0889 Other specified disorders of teeth and supporting structures: Secondary | ICD-10-CM | POA: Diagnosis not present

## 2021-10-20 DIAGNOSIS — J029 Acute pharyngitis, unspecified: Secondary | ICD-10-CM | POA: Diagnosis not present

## 2021-10-20 MED ORDER — HYDROCODONE-ACETAMINOPHEN 5-325 MG PO TABS: 1.0000 | ORAL_TABLET | ORAL | 0 refills | Status: AC | PRN

## 2021-10-20 MED ORDER — KETOROLAC TROMETHAMINE 30 MG/ML IJ SOLN
INTRAMUSCULAR | Status: AC
  Filled 2021-10-20: qty 1

## 2021-10-20 MED ORDER — KETOROLAC TROMETHAMINE 30 MG/ML IJ SOLN
30.0000 mg | Freq: Once | INTRAMUSCULAR | Status: AC
  Administered 2021-10-20: 30 mg via INTRAMUSCULAR

## 2021-10-20 MED ORDER — PREDNISONE 20 MG PO TABS: 40.0000 mg | ORAL_TABLET | Freq: Every day | ORAL | 0 refills | Status: AC

## 2021-10-20 MED ORDER — AMOXICILLIN-POT CLAVULANATE 875-125 MG PO TABS: 1.0000 | ORAL_TABLET | Freq: Two times a day (BID) | ORAL | 0 refills | Status: AC

## 2021-10-20 MED ORDER — LIDOCAINE VISCOUS HCL 2 % MT SOLN: 15.0000 mL | OROMUCOSAL | 0 refills | Status: AC | PRN

## 2021-10-20 NOTE — ED Triage Notes (Signed)
Pt reports having a sore throat,.  ?He has been taking amoxicillin and Norco for a tooth infection.  ?

## 2021-10-20 NOTE — Discharge Instructions (Addendum)
Your symptoms today are most likely be related to your tooth throat is clear with mild swelling and the ear and lymph nodes are swollen along your neck ? ?I will extend coverage of your antibiotic to Augmentin, take twice daily for 7 days after completion of amoxicillin ? ?Starting tomorrow take prednisone every morning with food for 5 days, this medication helps to reduce inflammation ? ?You may use Norco every 4 hours as needed for severe pain, please be mindful you have been dispensed a small amount, use sparingly ? ?You may gargle and spit lidocaine solution every 4 hours as needed to give a temporary numbing effect to your throat ? ?Please try to get a earlier appointment with your dentist for further management ?
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
## 2022-04-20 ENCOUNTER — Emergency Department (HOSPITAL_COMMUNITY)
Admission: EM | Admit: 2022-04-20 | Discharge: 2022-04-21 | Disposition: A | Discharge status: Home / Self Care | Payer: Medicaid Other | Attending: Emergency Medicine | Admitting: Emergency Medicine

## 2022-04-20 ENCOUNTER — Emergency Department (HOSPITAL_COMMUNITY): Payer: Medicaid Other

## 2022-04-20 DIAGNOSIS — R4182 Altered mental status, unspecified: Secondary | ICD-10-CM | POA: Diagnosis present

## 2022-04-20 DIAGNOSIS — T50901A Poisoning by unspecified drugs, medicaments and biological substances, accidental (unintentional), initial encounter: Secondary | ICD-10-CM

## 2022-04-20 DIAGNOSIS — R000 Tachycardia, unspecified: Secondary | ICD-10-CM | POA: Insufficient documentation

## 2022-04-20 DIAGNOSIS — R7989 Other specified abnormal findings of blood chemistry: Secondary | ICD-10-CM | POA: Diagnosis not present

## 2022-04-20 DIAGNOSIS — M549 Dorsalgia, unspecified: Secondary | ICD-10-CM

## 2022-04-20 DIAGNOSIS — M545 Low back pain, unspecified: Secondary | ICD-10-CM | POA: Diagnosis not present

## 2022-04-20 DIAGNOSIS — T424X1A Poisoning by benzodiazepines, accidental (unintentional), initial encounter: Secondary | ICD-10-CM | POA: Diagnosis not present

## 2022-04-20 DIAGNOSIS — R748 Abnormal levels of other serum enzymes: Secondary | ICD-10-CM

## 2022-04-20 LAB — CBC WITH DIFFERENTIAL/PLATELET
Abs Immature Granulocytes: 0.02 10*3/uL (ref 0.00–0.07)
Basophils Absolute: 0.1 10*3/uL (ref 0.0–0.1)
Basophils Relative: 1 %
Eosinophils Absolute: 0.2 10*3/uL (ref 0.0–0.5)
Eosinophils Relative: 3 %
HCT: 43.9 % (ref 39.0–52.0)
Hemoglobin: 14.6 g/dL (ref 13.0–17.0)
Immature Granulocytes: 0 %
Lymphocytes Relative: 24 %
Lymphs Abs: 1.5 10*3/uL (ref 0.7–4.0)
MCH: 30.6 pg (ref 26.0–34.0)
MCHC: 33.3 g/dL (ref 30.0–36.0)
MCV: 92 fL (ref 80.0–100.0)
Monocytes Absolute: 0.4 10*3/uL (ref 0.1–1.0)
Monocytes Relative: 7 %
Neutro Abs: 4.1 10*3/uL (ref 1.7–7.7)
Neutrophils Relative %: 65 %
Platelets: 293 10*3/uL (ref 150–400)
RBC: 4.77 MIL/uL (ref 4.22–5.81)
RDW: 14.3 % (ref 11.5–15.5)
WBC: 6.3 10*3/uL (ref 4.0–10.5)
nRBC: 0 % (ref 0.0–0.2)

## 2022-04-20 LAB — ACETAMINOPHEN LEVEL: Acetaminophen (Tylenol), Serum: <10 ug/mL — ABNORMAL LOW (ref 10–30)

## 2022-04-20 LAB — COMPREHENSIVE METABOLIC PANEL
ALT: 29 U/L (ref 0–44)
AST: 26 U/L (ref 15–41)
Albumin: 4.8 g/dL (ref 3.5–5.0)
Alkaline Phosphatase: 77 U/L (ref 38–126)
Anion gap: 8 (ref 5–15)
BUN: 19 mg/dL (ref 6–20)
CO2: 26 mmol/L (ref 22–32)
Calcium: 9.6 mg/dL (ref 8.9–10.3)
Chloride: 108 mmol/L (ref 98–111)
Creatinine, Ser: 1.34 mg/dL — ABNORMAL HIGH (ref 0.61–1.24)
GFR, Estimated: >60 mL/min (ref >60)
Glucose, Bld: 128 mg/dL — ABNORMAL HIGH (ref 70–99)
Potassium: 4.4 mmol/L (ref 3.5–5.1)
Sodium: 142 mmol/L (ref 135–145)
Total Bilirubin: 0.7 mg/dL (ref 0.3–1.2)
Total Protein: 8.8 g/dL — ABNORMAL HIGH (ref 6.5–8.1)

## 2022-04-20 LAB — CK
Total CK: 801 U/L — ABNORMAL HIGH (ref 49–397)
Total CK: 563 U/L — ABNORMAL HIGH (ref 49–397)

## 2022-04-20 LAB — ETHANOL: Alcohol, Ethyl (B): <10 mg/dL (ref <10)

## 2022-04-20 LAB — SALICYLATE LEVEL: Salicylate Lvl: <7 mg/dL — ABNORMAL LOW (ref 7.0–30.0)

## 2022-04-20 LAB — CBG MONITORING, ED: Glucose-Capillary: 131 mg/dL — ABNORMAL HIGH (ref 70–99)

## 2022-04-20 MED ORDER — CYCLOBENZAPRINE HCL 10 MG PO TABS
5.0000 mg | ORAL_TABLET | Freq: Once | ORAL | Status: AC
  Administered 2022-04-20: 5 mg via ORAL
  Filled 2022-04-20: qty 1

## 2022-04-20 MED ORDER — ACETAMINOPHEN 500 MG PO TABS
1000.0000 mg | ORAL_TABLET | ORAL | Status: AC
  Administered 2022-04-20: 1000 mg via ORAL
  Filled 2022-04-20: qty 2

## 2022-04-20 MED ORDER — LACTATED RINGERS IV BOLUS
1000.0000 mL | Freq: Once | INTRAVENOUS | Status: AC
  Administered 2022-04-20: 1000 mL via INTRAVENOUS

## 2022-04-20 MED ORDER — LIDOCAINE 5 % EX PTCH
2.0000 | MEDICATED_PATCH | CUTANEOUS | Status: DC
  Administered 2022-04-20: 2 via TRANSDERMAL
  Filled 2022-04-20: qty 2

## 2022-04-20 NOTE — ED Notes (Signed)
Heating pack applied to lower back.

## 2022-04-20 NOTE — ED Notes (Signed)
Into room to see if pt can walk and get urine sample. Pt refusing to get up. MD aware.

## 2022-04-20 NOTE — ED Notes (Signed)
Asked patient for urine, pt states he just ate and needs a moment. Fiance at bedside.
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
## 2022-04-20 NOTE — Discharge Instructions (Addendum)
Today you were seen in the emergency department for your overdose.    In the emergency department you were monitored had imaging and lab work that was reassuring.    At home, please refrain from illicit drug use.    Follow-up with your primary doctor in 2-3 days regarding your visit.    Return immediately to the emergency department if you experience any of the following: Difficulty breathing, confusion, or any other concerning symptoms.    Thank you for visiting our Emergency Department. It was a pleasure taking care of you today.

## 2022-04-20 NOTE — ED Notes (Signed)
Attempted to wake patient to get urine.  Pt not wanting to wake up and give urine sample. Family member states pt will not want I/O.

## 2022-04-20 NOTE — ED Triage Notes (Signed)
Ems brings pt in for overdose. Pt reports he snorted something he thought was cocaine. Pt reports he feels weak.

## 2022-04-21 NOTE — ED Notes (Signed)
Pt left before receiving discharge instructions, pt ambulatory w/fiance.
